# Patient Record
Sex: Female | Born: 2000 | Hispanic: Yes | Marital: Single | Smoking: Never smoker
Health system: Southern US, Community
[De-identification: ages and names within clinical notes are randomized; demographics above are authoritative.]

## PROBLEM LIST (undated history)

## (undated) HISTORY — PX: TONSILLECTOMY: SUR1361

---
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
## 2020-11-05 IMAGING — DX DG ANKLE COMPLETE 3+V*R*
3 series · 3 of 3 positions shown · non-contrast
Comparison: 08/14/2015

CLINICAL DATA: Fall down stairs.  Lateral ankle pain.

EXAM:
RIGHT ANKLE - COMPLETE 3+ VIEW

[ankle ap]
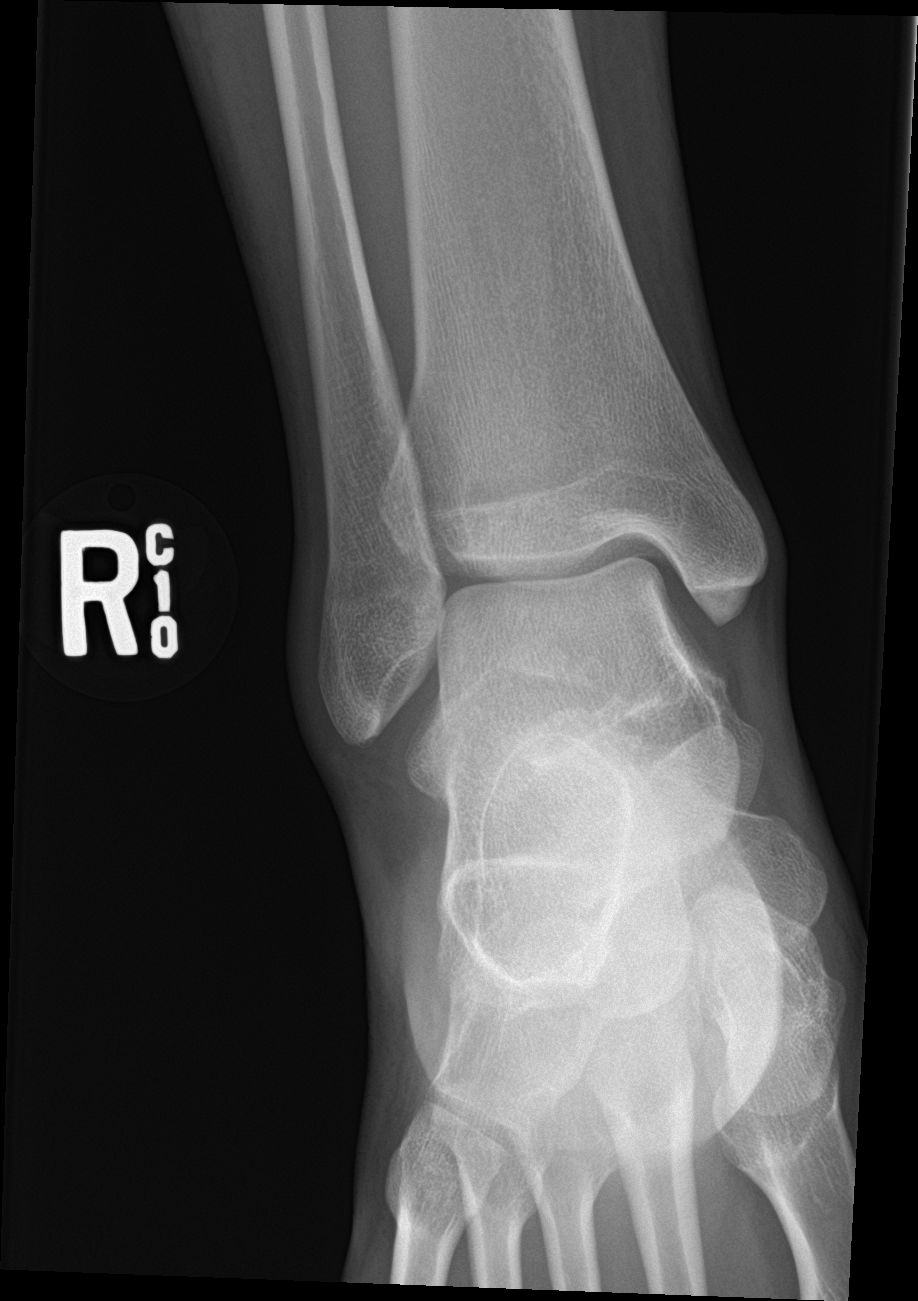

[ankle obl]
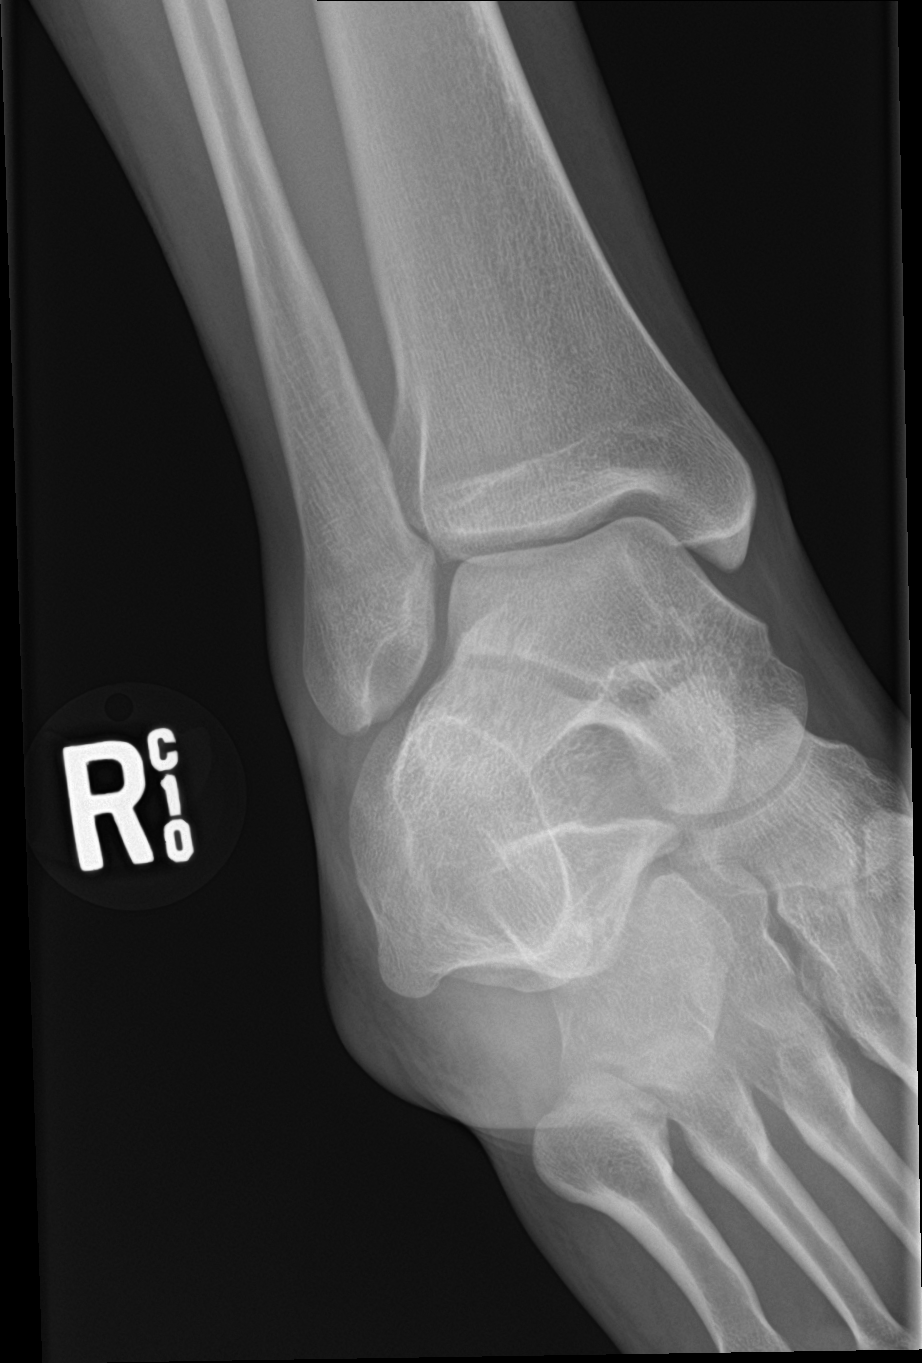

[ankle lat]
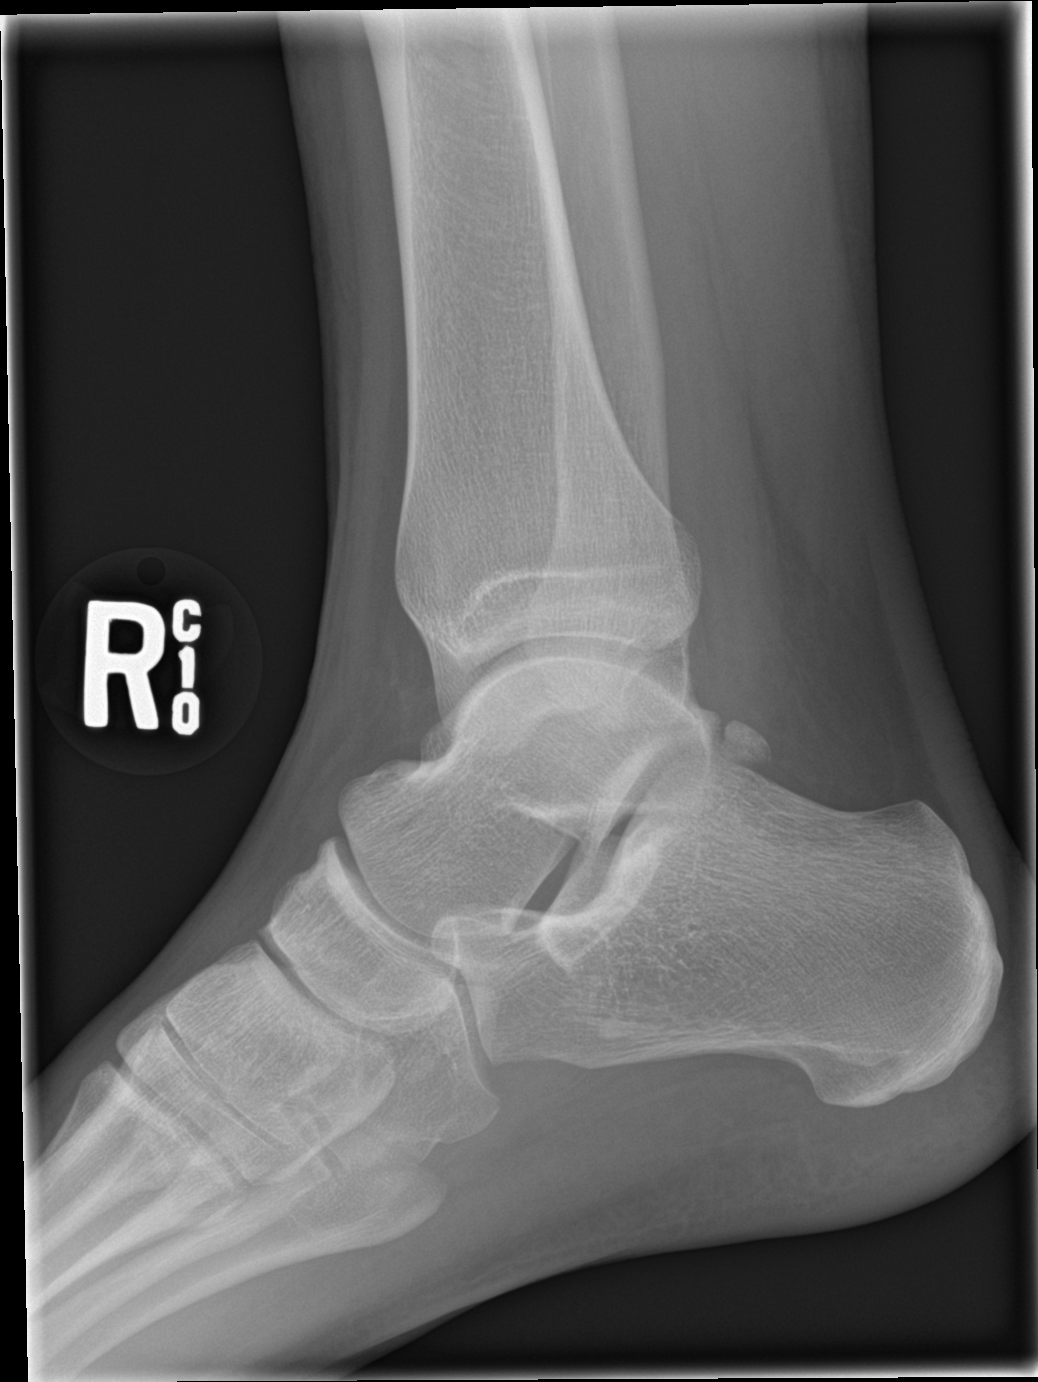

[3 of 3 positions shown; findings below may reference images not displayed]

FINDINGS: There is no evidence of fracture, dislocation, or joint effusion.
There is no evidence of arthropathy or other focal bone abnormality.
Soft tissues are unremarkable.
IMPRESSION: Negative.

## 2022-02-08 ENCOUNTER — Encounter (HOSPITAL_COMMUNITY): Payer: Self-pay | Admitting: *Deleted

## 2022-02-08 ENCOUNTER — Ambulatory Visit (HOSPITAL_COMMUNITY)
Admission: EM | Admit: 2022-02-08 | Discharge: 2022-02-08 | Disposition: A | Discharge status: Home / Self Care | Payer: Medicaid Other | Attending: Internal Medicine | Admitting: Internal Medicine

## 2022-02-08 ENCOUNTER — Other Ambulatory Visit: Payer: Self-pay

## 2022-02-08 DIAGNOSIS — E063 Autoimmune thyroiditis: Secondary | ICD-10-CM | POA: Diagnosis not present

## 2022-02-08 DIAGNOSIS — R1013 Epigastric pain: Secondary | ICD-10-CM | POA: Diagnosis present

## 2022-02-08 DIAGNOSIS — R6881 Early satiety: Secondary | ICD-10-CM | POA: Insufficient documentation

## 2022-02-08 DIAGNOSIS — R233 Spontaneous ecchymoses: Secondary | ICD-10-CM | POA: Diagnosis present

## 2022-02-08 LAB — CBC WITH DIFFERENTIAL/PLATELET
Abs Immature Granulocytes: 0.04 10*3/uL (ref 0.00–0.07)
Basophils Absolute: 0.1 10*3/uL (ref 0.0–0.1)
Basophils Relative: 1 %
Eosinophils Absolute: 0 10*3/uL (ref 0.0–0.5)
Eosinophils Relative: 0 %
HCT: 43.8 % (ref 36.0–46.0)
Hemoglobin: 14.7 g/dL (ref 12.0–15.0)
Immature Granulocytes: 0 %
Lymphocytes Relative: 25 %
Lymphs Abs: 2.6 10*3/uL (ref 0.7–4.0)
MCH: 29.2 pg (ref 26.0–34.0)
MCHC: 33.6 g/dL (ref 30.0–36.0)
MCV: 87.1 fL (ref 80.0–100.0)
Monocytes Absolute: 0.9 10*3/uL (ref 0.1–1.0)
Monocytes Relative: 8 %
Neutro Abs: 6.8 10*3/uL (ref 1.7–7.7)
Neutrophils Relative %: 66 %
Platelets: 246 10*3/uL (ref 150–400)
RBC: 5.03 MIL/uL (ref 3.87–5.11)
RDW: 12 % (ref 11.5–15.5)
WBC: 10.4 10*3/uL (ref 4.0–10.5)
nRBC: 0 % (ref 0.0–0.2)

## 2022-02-08 LAB — COMPREHENSIVE METABOLIC PANEL
ALT: 20 U/L (ref 0–44)
AST: 20 U/L (ref 15–41)
Albumin: 4.9 g/dL (ref 3.5–5.0)
Alkaline Phosphatase: 41 U/L (ref 38–126)
Anion gap: 13 (ref 5–15)
BUN: 6 mg/dL (ref 6–20)
CO2: 22 mmol/L (ref 22–32)
Calcium: 10 mg/dL (ref 8.9–10.3)
Chloride: 104 mmol/L (ref 98–111)
Creatinine, Ser: 0.86 mg/dL (ref 0.44–1.00)
GFR, Estimated: >60 mL/min (ref >60)
Glucose, Bld: 88 mg/dL (ref 70–99)
Potassium: 3.6 mmol/L (ref 3.5–5.1)
Sodium: 139 mmol/L (ref 135–145)
Total Bilirubin: 1 mg/dL (ref 0.3–1.2)
Total Protein: 7.2 g/dL (ref 6.5–8.1)

## 2022-02-08 LAB — TSH: TSH: 1.799 u[IU]/mL (ref 0.350–4.500)

## 2022-02-08 LAB — POC URINE PREG, ED: Preg Test, Ur: NEGATIVE

## 2022-02-08 MED ORDER — ONDANSETRON 4 MG PO TBDP: 4.0000 mg | ORAL_TABLET | Freq: Three times a day (TID) | ORAL | 0 refills | Status: DC | PRN

## 2022-02-08 MED ORDER — PANTOPRAZOLE SODIUM 20 MG PO TBEC: 20.0000 mg | DELAYED_RELEASE_TABLET | Freq: Every day | ORAL | 0 refills | Status: DC

## 2022-02-08 NOTE — ED Provider Notes (Signed)
[facility]    CSN: [id number] Arrival date & time: 02/08/22  1349      History   Chief Complaint Chief Complaint  Patient presents with   Nausea   Anxiety    HPI Diana Henry is a 21 y.o. female comes to the urgent care with 1 week history of nausea, abnormal abdominal sensation and early satiety.  Patient says the symptoms started insidiously and has been persistent.  Nausea is aggravated by smell of certain foods but she denies any vomiting.  No abdominal distention, weight loss or night sweats.  Patient has been taking some supplements including creatine for bodybuilding.  Last menstrual period was a couple of weeks ago.  Her menstrual cycle are regular.  She also endorses a history of Hashimoto's thyroiditis.  Patient describes spontaneous bruising on both eyes.  No excessive bleeding or menorrhagia.Marland Kitchen   HPI  History reviewed. No pertinent past medical history.  There are no problems to display for this patient.   Past Surgical History:  Procedure Laterality Date   TONSILLECTOMY      OB History   No obstetric history on file.      Home Medications    Prior to Admission medications   Medication Sig Start Date End Date Taking? Authorizing Provider  ondansetron (ZOFRAN-ODT) 4 MG disintegrating tablet Take 1 tablet (4 mg total) by mouth every 8 (eight) hours as needed for nausea or vomiting. 02/08/22  Yes Vail Basista, Britta Mccreedy, MD  pantoprazole (PROTONIX) 20 MG tablet Take 1 tablet (20 mg total) by mouth daily. 02/08/22  Yes Arianni Gallego, Britta Mccreedy, MD    Family History Family History  Problem Relation Age of Onset   Wilson's disease Mother    Wilson's disease Father    Wilson's disease Sister    Wilson's disease Brother     Social History Social History   Tobacco Use   Smoking status: Never   Smokeless tobacco: Never     Allergies   Patient has no known allergies.   Review of Systems Review of Systems As per HPI Physical  Exam Triage Vital Signs ED Triage Vitals  Enc Vitals Group     BP 02/08/22 1448 126/80     Pulse Rate 02/08/22 1448 87     Resp 02/08/22 1448 18     Temp 02/08/22 1448 97.8 F (36.6 C)     Temp src --      SpO2 02/08/22 1448 96 %     Weight --      Height --      Head Circumference --      Peak Flow --      Pain Score 02/08/22 1446 0     Pain Loc --      Pain Edu? --      Excl. in GC? --    No data found.  Updated Vital Signs BP 126/80   Pulse 87   Temp 97.8 F (36.6 C)   Resp 18   LMP 01/26/2022 (Approximate)   SpO2 96%   Visual Acuity Right Eye Distance:   Left Eye Distance:   Bilateral Distance:    Right Eye Near:   Left Eye Near:    Bilateral Near:     Physical Exam Vitals and nursing note reviewed.  Constitutional:      General: She is not in acute distress.    Appearance: She is not ill-appearing.  HENT:     Mouth/Throat:     Mouth: Mucous membranes  are moist.  Eyes:     General: No scleral icterus.       Right eye: No discharge.        Left eye: No discharge.     Conjunctiva/sclera: Conjunctivae normal.  Cardiovascular:     Rate and Rhythm: Normal rate and regular rhythm.     Pulses: Normal pulses.     Heart sounds: Normal heart sounds.  Pulmonary:     Effort: Pulmonary effort is normal.     Breath sounds: Normal breath sounds.  Abdominal:     General: Bowel sounds are normal. There is no distension.     Palpations: Abdomen is soft. There is no mass.     Tenderness: There is no guarding or rebound.     Comments: No hepatomegaly or splenomegaly.  Musculoskeletal:     Comments: No joint swelling.  No lymphadenopathy.  Skin:    Comments: Bruises on both thighs.  No other areas involved.  Neurological:     Mental Status: She is alert.      UC Treatments / Results  Labs (all labs ordered are listed, but only abnormal results are displayed) Labs Reviewed  CBC WITH DIFFERENTIAL/PLATELET  COMPREHENSIVE METABOLIC PANEL  TSH  POC URINE  PREG, ED    EKG   Radiology No results found.  Procedures Procedures (including critical care time)  Medications Ordered in UC Medications - No data to display  Initial Impression / Assessment and Plan / UC Course  I have reviewed the triage vital signs and the nursing notes.  Pertinent labs & imaging results that were available during my care of the patient were reviewed by me and considered in my medical decision making (see chart for details).     1.  Dyspepsia: Avoid spicy food Protonix Zofran as needed for nausea Maintain adequate hydration Please return to urgent care if you have worsening symptoms If symptoms persist patient is advised to follow-up with primary care physician.  2.  Easy bruising: CBC, CMP, TSH We will call patient with recommendations if labs are abnormal. Return precautions given. Final Clinical Impressions(s) / UC Diagnoses   Final diagnoses:  Dyspepsia  Easy bruising     Discharge Instructions      We will call you with recommendations if labs are abnormal Please take medications as recommended Please avoid spicy food for the next couple of weeks to see if your symptoms improve If you experience worsening symptoms or persistent abdominal discomfort you will need to follow-up primary care for further evaluation.   ED Prescriptions     Medication Sig Dispense Auth. Provider   pantoprazole (PROTONIX) 20 MG tablet Take 1 tablet (20 mg total) by mouth daily. 30 tablet Quavis Klutz, Britta Mccreedy, MD   ondansetron (ZOFRAN-ODT) 4 MG disintegrating tablet Take 1 tablet (4 mg total) by mouth every 8 (eight) hours as needed for nausea or vomiting. 20 tablet Jamesina Gaugh, Britta Mccreedy, MD      PDMP not reviewed this encounter.   Merrilee Jansky, MD 02/08/22 [phone]

## 2022-02-08 NOTE — Discharge Instructions (Addendum)
We will call you with recommendations if labs are abnormal Please take medications as recommended Please avoid spicy food for the next couple of weeks to see if your symptoms improve If you experience worsening symptoms or persistent abdominal discomfort you will need to follow-up primary care for further evaluation.

## 2022-02-08 NOTE — ED Triage Notes (Signed)
Pt reports for 11/2 weeks having nausea when she wakes up.The smell of food makes her nauseated. Pt also reports anxiety. Pt just broke up with a boy friend of 2 years and Sx's may being coming from stress.

## 2022-05-11 ENCOUNTER — Ambulatory Visit
Admission: EM | Admit: 2022-05-11 | Discharge: 2022-05-11 | Disposition: A | Discharge status: Home / Self Care | Payer: Medicaid Other

## 2022-05-11 ENCOUNTER — Ambulatory Visit (INDEPENDENT_AMBULATORY_CARE_PROVIDER_SITE_OTHER): Payer: Medicaid Other

## 2022-05-11 DIAGNOSIS — M79642 Pain in left hand: Secondary | ICD-10-CM

## 2022-05-11 DIAGNOSIS — S60222A Contusion of left hand, initial encounter: Secondary | ICD-10-CM

## 2022-05-11 DIAGNOSIS — M79645 Pain in left finger(s): Secondary | ICD-10-CM

## 2022-05-11 MED ORDER — IBUPROFEN 400 MG PO TABS: 400.0000 mg | ORAL_TABLET | Freq: Four times a day (QID) | ORAL | 0 refills | Status: DC | PRN

## 2022-05-11 NOTE — ED Provider Notes (Signed)
[facility]  Note:  This document was prepared using Systems analyst and may include unintentional dictation errors.  MRN: [id number] DOB: October 16, 2000  Subjective:   Diana Henry is a 22 y.o. female presenting for 1 day history of left thumb pain.  Patient was horse playing with her mother, ended up having her hand jammed up against her back causing the left thumb injury.  Has had decreased range of motion and pain at the left thumb base extending into the hand.  No current facility-administered medications for this encounter.  Current Outpatient Medications:    FLUoxetine (PROZAC) 10 MG capsule, Take 10 mg by mouth daily., Disp: , Rfl:    ondansetron (ZOFRAN-ODT) 4 MG disintegrating tablet, Take 1 tablet (4 mg total) by mouth every 8 (eight) hours as needed for nausea or vomiting., Disp: 20 tablet, Rfl: 0   pantoprazole (PROTONIX) 20 MG tablet, Take 1 tablet (20 mg total) by mouth daily., Disp: 30 tablet, Rfl: 0   No Known Allergies  History reviewed. No pertinent past medical history.   Past Surgical History:  Procedure Laterality Date   TONSILLECTOMY      Family History  Problem Relation Age of Onset   Wilson's disease Mother    Wilson's disease Father    Wilson's disease Sister    Wilson's disease Brother     Social History   Tobacco Use   Smoking status: Never   Smokeless tobacco: Never  Vaping Use   Vaping Use: Some days  Substance Use Topics   Alcohol use: Yes    Comment: occ    ROS   Objective:   Vitals: BP 119/75 (BP Location: Right Arm)   Pulse 78   Temp 98.6 F (37 C) (Oral)   Resp 18   Ht 5' 3"$  (1.6 m)   Wt 120 lb (54.4 kg)   LMP 04/23/2022   SpO2 98%   BMI 21.26 kg/m   Physical Exam Constitutional:      General: She is not in acute distress.    Appearance: Normal appearance. She is well-developed. She is not ill-appearing, toxic-appearing or diaphoretic.  HENT:     Head:  Normocephalic and atraumatic.     Nose: Nose normal.     Mouth/Throat:     Mouth: Mucous membranes are moist.  Eyes:     General: No scleral icterus.       Right eye: No discharge.        Left eye: No discharge.     Extraocular Movements: Extraocular movements intact.  Cardiovascular:     Rate and Rhythm: Normal rate.  Pulmonary:     Effort: Pulmonary effort is normal.  Musculoskeletal:       Hands:  Skin:    General: Skin is warm and dry.  Neurological:     General: No focal deficit present.     Mental Status: She is alert and oriented to person, place, and time.  Psychiatric:        Mood and Affect: Mood normal.        Behavior: Behavior normal.     DG Hand Complete Left  Result Date: 05/11/2022 CLINICAL DATA:  left hand pain EXAM: LEFT HAND - COMPLETE 3+ VIEW COMPARISON:  None Available. FINDINGS: There is no evidence of fracture or dislocation. There is no evidence of arthropathy or other focal bone abnormality. Soft tissues are unremarkable. IMPRESSION: No acute displaced fracture, dislocation or significant degenerative change within the LEFT hand.  Electronically Signed   By: Michaelle Birks M.D.   On: 05/11/2022 14:36    2 inch Ace wrap applied to the left thumb, hand and wrist in thumb spica fashion.  Assessment and Plan :   PDMP not reviewed this encounter.  1. Contusion of left hand, initial encounter   2. Left hand pain   3. Pain of left thumb     Recommended conservative management for left hand/thumb.  Use RICE method, ibuprofen for pain and inflammation. Counseled patient on potential for adverse effects with medications prescribed/recommended today, ER and return-to-clinic precautions discussed, patient verbalized understanding.    Jaynee Eagles, [place] 05/11/22 1441

## 2022-05-11 NOTE — ED Triage Notes (Signed)
Pt states that she injured her left thumb. X1 day

## 2023-01-09 DIAGNOSIS — L309 Dermatitis, unspecified: Secondary | ICD-10-CM | POA: Insufficient documentation

## 2023-07-09 DIAGNOSIS — J4520 Mild intermittent asthma, uncomplicated: Secondary | ICD-10-CM | POA: Insufficient documentation

## 2023-08-21 ENCOUNTER — Encounter: Payer: Self-pay | Admitting: Advanced Practice Midwife

## 2023-08-21 ENCOUNTER — Other Ambulatory Visit (HOSPITAL_COMMUNITY)
Admission: RE | Admit: 2023-08-21 | Discharge: 2023-08-21 | Disposition: A | Discharge status: Home / Self Care | Source: Ambulatory Visit | Attending: Advanced Practice Midwife | Admitting: Advanced Practice Midwife

## 2023-08-21 ENCOUNTER — Ambulatory Visit: Admitting: Advanced Practice Midwife

## 2023-08-21 VITALS — BP 124/75 | HR 86 | Ht 64.0 in | Wt 150.2 lb

## 2023-08-21 DIAGNOSIS — Z124 Encounter for screening for malignant neoplasm of cervix: Secondary | ICD-10-CM | POA: Diagnosis present

## 2023-08-21 DIAGNOSIS — Z3009 Encounter for other general counseling and advice on contraception: Secondary | ICD-10-CM | POA: Diagnosis present

## 2023-08-21 DIAGNOSIS — Z01419 Encounter for gynecological examination (general) (routine) without abnormal findings: Secondary | ICD-10-CM

## 2023-08-21 DIAGNOSIS — Z30430 Encounter for insertion of intrauterine contraceptive device: Secondary | ICD-10-CM | POA: Diagnosis not present

## 2023-08-21 DIAGNOSIS — Z3202 Encounter for pregnancy test, result negative: Secondary | ICD-10-CM | POA: Diagnosis not present

## 2023-08-21 LAB — POCT URINE PREGNANCY: Preg Test, Ur: NEGATIVE

## 2023-08-21 MED ORDER — PARAGARD INTRAUTERINE COPPER IU IUD
1.0000 | INTRAUTERINE_SYSTEM | Freq: Once | INTRAUTERINE | Status: AC
  Administered 2023-08-21: 1 via INTRAUTERINE

## 2023-08-21 MED ORDER — NAPROXEN 500 MG PO TABS: 500.0000 mg | ORAL_TABLET | Freq: Two times a day (BID) | ORAL | 3 refills | Status: AC

## 2023-08-21 NOTE — Progress Notes (Signed)
 Had copper  IUD prior had it taken out last year. Is wanting same copper  IUD replaced.   Asked her about family history. States she is unsure of that current documentation.   No other concerns at this time.

## 2023-08-21 NOTE — Progress Notes (Signed)
 Subjective:     Diana Henry is a 23 y.o. female here at [facility] for a routine exam.  Current complaints: none.  Personal and family health history reviewed: yes.  Do you have a primary care provider? yes Do you feel safe at home? yes  Flowsheet Row Office Visit from 08/21/2023 in [facility] for [facility] Total Score 0      Flowsheet Row Office Visit from 08/21/2023 in [facility] for [facility] at [facility]  PHQ-9 Total Score 2        Health Maintenance Due  Topic Date Due   HPV VACCINES (1 - 3-dose series) Never done   Meningococcal B Vaccine (1 of 2 - Standard) Never done   Hepatitis C Screening  Never done   DTaP/Tdap/Td (1 - Tdap) Never done   Pneumococcal Vaccine 62-11 Years old (1 of 2 - PCV) Never done   Cervical Cancer Screening (Pap smear)  Never done   COVID-19 Vaccine (4 - 2024-25 season) 11/24/2022     Risk factors for chronic health problems: Smoking: Alchohol/how much: Pt BMI: Body mass index is 25.78 kg/m.   Gynecologic History Patient's last menstrual period was 07/23/2023 (approximate). Contraception: condoms Last Pap: unsure, no records.  Last mammogram: n/a  Obstetric History OB History  No obstetric history on file.     The following portions of the patient's history were reviewed and updated as appropriate: allergies, current medications, past family history, past medical history, past social history, past surgical history, and problem list.  Review of Systems Pertinent items noted in HPI and remainder of comprehensive ROS otherwise negative.    Objective:   Today's Vitals   08/21/23 1405  BP: 124/75  Pulse: 86  Weight: 150 lb 3.2 oz (68.1 kg)  Height: 5\' 4"  (1.626 m)   Body mass index is 25.78 kg/m.  VS reviewed, nursing note reviewed,  Constitutional: well developed, well nourished, no distress HEENT: normocephalic, thyroid  without enlargement or mass HEART: RRR, no  murmurs rubs/gallops RESP: clear and equal to auscultation bilaterally in all lobes  Breast Exam:  Deferred with low risks and shared decision making, discussed recommendation to start mammogram between 40-50 yo/  Abdomen: soft Neuro: alert and oriented x 3 Skin: warm, dry Psych: affect normal Pelvic exam: Performed: Cervix pink, visually closed, without lesion, scant white creamy discharge, vaginal walls and external genitalia normal Bimanual exam: Cervix 0/long/high, firm, anterior, neg CMT, uterus nontender, nonenlarged, adnexa without tenderness, enlargement, or mass      IUD Procedure Note Patient identified, informed consent performed.  Discussed risks of irregular bleeding, cramping, infection, malpositioning or misplacement of the IUD outside the uterus which may require further procedures. Time out was performed.  Urine pregnancy test negative.  Speculum placed in the vagina.  Cervix visualized.  Cleaned with Betadine x 2.  Grasped anteriorly with a single tooth tenaculum.  Uterus sounded to 6 cm.  Paragard  IUD placed per manufacturer's recommendations.  Strings trimmed to 3 cm. Tenaculum was removed, good hemostasis noted.  Patient tolerated procedure well.   Patient was given post-procedure instructions and the Paragard  care card with expiration date.  Patient was also asked to check IUD strings periodically and follow up in 4-6 weeks for IUD check.      Assessment/Plan:   1. Encounter for annual routine gynecological examination   2. Encounter for counseling regarding contraception (Primary) --Discussed pt contraceptive plans and reviewed contraceptive methods based on pt preferences  and effectiveness.  Pt prefers Paragard  IUD.  She had Paragard  before, which caused heavy periods and cramping but she wants to avoid hormones.  Will try Paragard  again, and can remove and discuss further if problems with menses.  --Of note, chart review prior to IUD insertion listed Wilson's  Disease for several family members. Given issues with copper  and pt desires a copper  IUD, we discussed this today. Pt reports this must be an error as she is not aware of this and knows her family hx pretty well.  Pt to f/u with PCP and I removed this from our records today.  Pt to notify office if any new side effects with Paragard  IUD.    - Amb ref to [facility] - POCT urine pregnancy - Cervicovaginal ancillary only( [place])  3. Screening for cervical cancer  - Cytology - PAP( [place])  4. Encounter for IUD insertion --Paragard  IUD inserted without difficulty. Pt tolerated well. See above procedure note.   --Rx for naproxen 500 mg BID during menses to reduce pain/bleeding     Return in about 4 weeks (around 09/18/2023) for string check.   Arlester Bence, CNM 5:37 PM

## 2023-08-22 LAB — CERVICOVAGINAL ANCILLARY ONLY
Chlamydia: NEGATIVE
Comment: NEGATIVE
Comment: NEGATIVE
Comment: NORMAL
Neisseria Gonorrhea: NEGATIVE
Trichomonas: NEGATIVE

## 2023-08-28 LAB — CYTOLOGY - PAP: Diagnosis: NEGATIVE

## 2023-09-09 NOTE — Progress Notes (Deleted)
 GYNECOLOGY  VISIT   HPI: Diana Henry is a 23 y.o.   Single  {Race/ethnicity:17218}  female No obstetric history on file. here for IUD string check     GYNECOLOGIC HISTORY: Patient's last menstrual period was 07/23/2023 (approximate). Contraception: Paragard  IUD inserted 08/21/23 Last mammogram:  Never previously done due to age Last pap smear:  Diagnosis  Date Value Ref Range Status  08/21/2023   Final   - Negative for intraepithelial lesion or malignancy (NILM)           OB History   No obstetric history on file.        Patient Active Problem List   Diagnosis Date Noted   Encounter for IUD insertion 08/21/2023   Mild intermittent asthma without complication 07/09/2023   Eczema of both hands 01/09/2023   Unspecified visual loss 02/24/2013    No past medical history on file.  Past Surgical History:  Procedure Laterality Date   TONSILLECTOMY      Current Outpatient Medications  Medication Sig Dispense Refill   naproxen  (NAPROSYN ) 500 MG tablet Take 1 tablet (500 mg total) by mouth 2 (two) times daily with a meal. Take as directed during your menstrual period. 30 tablet 3   No current facility-administered medications for this visit.     ALLERGIES: Patient has no known allergies.  No family history on file.  Social History   Socioeconomic History   Marital status: Single    Spouse name: Not on file   Number of children: Not on file   Years of education: Not on file   Highest education level: Not on file  Occupational History   Not on file  Tobacco Use   Smoking status: Never   Smokeless tobacco: Never  Vaping Use   Vaping status: Some Days  Substance and Sexual Activity   Alcohol use: Yes    Comment: occ   Drug use: Not on file   Sexual activity: Not on file  Other Topics Concern   Not on file  Social History Narrative   Not on file   Social Drivers of Health   Financial Resource Strain: Not on File (07/12/2021)   Received from [employer]    Financial Resource Strain: 0  Food Insecurity: Not on File (12/19/2022)   Received from [facility] Insecurity    Food: 0  Transportation Needs: Not on File (07/12/2021)   Received from [facility] Needs    Transportation: 0  Physical Activity: Not on File (07/12/2021)   Received from [facility]   Physical Activity    Physical Activity: 0  Stress: Not on File (07/12/2021)   Received from [facility]   Stress    Stress: 0  Social Connections: Not on File (12/07/2022)   Received from [facility]   Social Connections    Connectedness: 0  Intimate Partner Violence: Not on file    Review of Systems  PHYSICAL EXAMINATION:    LMP 07/23/2023 (Approximate)     General appearance: alert, cooperative and appears stated age Head: Normocephalic, without obvious abnormality, atraumatic Neck: no adenopathy, supple, symmetrical, trachea midline and thyroid  normal to inspection and palpation Lungs: clear to auscultation bilaterally Breasts: normal appearance, no masses or tenderness, No nipple retraction or dimpling, No nipple discharge or bleeding, No axillary or supraclavicular adenopathy Heart: regular rate and rhythm Abdomen: soft, non-tender, no masses,  no organomegaly Extremities: extremities normal, atraumatic, no cyanosis or edema Skin: Skin color, texture, turgor  normal. No rashes or lesions Lymph nodes: Cervical, supraclavicular, and axillary nodes normal. No abnormal inguinal nodes palpated Neurologic: Grossly normal  Pelvic: External genitalia:  no lesions              Urethra:  normal appearing urethra with no masses, tenderness or lesions              Bartholins and Skenes: normal                 Vagina: normal appearing vagina with normal color and discharge, no lesions              Cervix: no lesions                Chaperone was present for exam  ASSESSMENT & PLAN  There are no diagnoses linked to this encounter.      An After Visit  Summary was printed and given to the patient.   Shaida Route E Jaskarn Schweer, [place] 6/17/20255:08 PM

## 2023-09-12 ENCOUNTER — Ambulatory Visit: Admitting: Physician Assistant

## 2023-09-21 NOTE — Progress Notes (Deleted)
 GYNECOLOGY  VISIT   HPI: Diana Henry is a 23 y.o.   Single  {Race/ethnicity:17218}  female  No obstetric history on file. here for string check     GYNECOLOGIC HISTORY: No LMP recorded. Contraception: Paraguard IUD insertion 08/21/23 Menopausal hormone therapy: Premenopausal Last mammogram:  *** Last pap smear:  Diagnosis  Date Value Ref Range Status  08/21/2023   Final   - Negative for intraepithelial lesion or malignancy (NILM)           OB History   No obstetric history on file.        Patient Active Problem List   Diagnosis Date Noted   Encounter for IUD insertion 08/21/2023   Mild intermittent asthma without complication 07/09/2023   Eczema of both hands 01/09/2023   Unspecified visual loss 02/24/2013    No past medical history on file.  Past Surgical History:  Procedure Laterality Date   TONSILLECTOMY      Current Outpatient Medications  Medication Sig Dispense Refill   naproxen  (NAPROSYN ) 500 MG tablet Take 1 tablet (500 mg total) by mouth 2 (two) times daily with a meal. Take as directed during your menstrual period. 30 tablet 3   No current facility-administered medications for this visit.     ALLERGIES: Patient has no known allergies.  No family history on file.  Social History   Socioeconomic History   Marital status: Single    Spouse name: Not on file   Number of children: Not on file   Years of education: Not on file   Highest education level: Not on file  Occupational History   Not on file  Tobacco Use   Smoking status: Never   Smokeless tobacco: Never  Vaping Use   Vaping status: Some Days  Substance and Sexual Activity   Alcohol use: Yes    Comment: occ   Drug use: Not on file   Sexual activity: Not on file  Other Topics Concern   Not on file  Social History Narrative   Not on file   Social Drivers of Health   Financial Resource Strain: Not on File (07/12/2021)   Received from [employer] [facility]     Financial Resource Strain: 0  Food Insecurity: Not on File (12/19/2022)   Received from [facility] Insecurity    Food: 0  Transportation Needs: Not on File (07/12/2021)   Received from [facility] [facility] Needs    Transportation: 0  Physical Activity: Not on File (07/12/2021)   Received from [facility]   Physical Activity    Physical Activity: 0  Stress: Not on File (07/12/2021)   Received from [facility]   Stress    Stress: 0  Social Connections: Not on File (12/07/2022)   Received from [facility]   Social Connections    Connectedness: 0  Intimate Partner Violence: Not on file    Review of Systems  PHYSICAL EXAMINATION:    There were no vitals taken for this visit.    General appearance: alert, cooperative and appears stated age Head: Normocephalic, without obvious abnormality, atraumatic Neck: no adenopathy, supple, symmetrical, trachea midline and thyroid  normal to inspection and palpation Lungs: clear to auscultation bilaterally Breasts: normal appearance, no masses or tenderness, No nipple retraction or dimpling, No nipple discharge or bleeding, No axillary or supraclavicular adenopathy Heart: regular rate and rhythm Abdomen: soft, non-tender, no masses,  no organomegaly Extremities: extremities normal, atraumatic, no cyanosis or edema Skin: Skin color, texture, turgor normal.  No rashes or lesions Lymph nodes: Cervical, supraclavicular, and axillary nodes normal. No abnormal inguinal nodes palpated Neurologic: Grossly normal  Pelvic: External genitalia:  no lesions              Urethra:  normal appearing urethra with no masses, tenderness or lesions              Bartholins and Skenes: normal                 Vagina: normal appearing vagina with normal color and discharge, no lesions              Cervix: no lesions                Bimanual Exam:  Uterus:  normal size, contour, position, consistency, mobility, non-tender              Adnexa: no mass, fullness, tenderness               Rectal exam: {yes no:314532}.  Confirms.              Anus:  normal sphincter tone, no lesions  Chaperone was present for exam  ASSESSMENT & PLAN  There are no diagnoses linked to this encounter.      An After Visit Summary was printed and given to the patient.   Alexio Sroka E Jeannelle Wiens, PA-C 6/29/20253:09 PM

## 2023-09-25 ENCOUNTER — Ambulatory Visit: Admitting: Physician Assistant
